# Patient Record
Sex: Female | Born: 1950 | Race: White | Hispanic: No | Marital: Married
Health system: Southern US, Community
[De-identification: ages and names within clinical notes are randomized; demographics above are authoritative.]

## PROBLEM LIST (undated history)

## (undated) DIAGNOSIS — J329 Chronic sinusitis, unspecified: Secondary | ICD-10-CM

## (undated) DIAGNOSIS — E113599 Type 2 diabetes mellitus with proliferative diabetic retinopathy without macular edema, unspecified eye: Secondary | ICD-10-CM

## (undated) DIAGNOSIS — I10 Essential (primary) hypertension: Secondary | ICD-10-CM

## (undated) DIAGNOSIS — N951 Menopausal and female climacteric states: Secondary | ICD-10-CM

## (undated) DIAGNOSIS — E119 Type 2 diabetes mellitus without complications: Secondary | ICD-10-CM

## (undated) DIAGNOSIS — E785 Hyperlipidemia, unspecified: Secondary | ICD-10-CM

## (undated) HISTORY — DX: Essential (primary) hypertension: I10

## (undated) HISTORY — PX: MOUTH SURGERY: SHX715

## (undated) HISTORY — DX: Type 2 diabetes mellitus without complications: E11.9

## (undated) HISTORY — DX: Type 2 diabetes mellitus with proliferative diabetic retinopathy without macular edema, unspecified eye: E11.3599

## (undated) HISTORY — DX: Hyperlipidemia, unspecified: E78.5

## (undated) HISTORY — DX: Chronic sinusitis, unspecified: J32.9

## (undated) HISTORY — DX: Menopausal and female climacteric states: N95.1

## (undated) HISTORY — PX: NASAL SINUS SURGERY: SHX719

---

## 1998-11-19 ENCOUNTER — Other Ambulatory Visit: Admission: RE | Admit: 1998-11-19 | Discharge: 1998-11-19 | Payer: Self-pay | Admitting: Oral Surgery

## 1999-11-29 ENCOUNTER — Other Ambulatory Visit: Admission: RE | Admit: 1999-11-29 | Discharge: 1999-11-29 | Payer: Self-pay | Admitting: Obstetrics & Gynecology

## 2004-11-08 ENCOUNTER — Other Ambulatory Visit: Admission: RE | Admit: 2004-11-08 | Discharge: 2004-11-08 | Payer: Self-pay | Admitting: Obstetrics & Gynecology

## 2010-07-13 ENCOUNTER — Other Ambulatory Visit: Payer: Self-pay | Admitting: Family Medicine

## 2010-07-13 DIAGNOSIS — E041 Nontoxic single thyroid nodule: Secondary | ICD-10-CM

## 2010-07-15 ENCOUNTER — Ambulatory Visit
Admission: RE | Admit: 2010-07-15 | Discharge: 2010-07-15 | Disposition: A | Discharge status: Home / Self Care | Payer: BLUE CROSS/BLUE SHIELD | Source: Ambulatory Visit | Attending: Family Medicine | Admitting: Family Medicine

## 2010-07-15 DIAGNOSIS — E041 Nontoxic single thyroid nodule: Secondary | ICD-10-CM

## 2011-07-04 ENCOUNTER — Other Ambulatory Visit: Payer: Self-pay | Admitting: Family Medicine

## 2011-07-04 DIAGNOSIS — E041 Nontoxic single thyroid nodule: Secondary | ICD-10-CM

## 2011-07-11 ENCOUNTER — Ambulatory Visit
Admission: RE | Admit: 2011-07-11 | Discharge: 2011-07-11 | Disposition: A | Discharge status: Home / Self Care | Payer: 59 | Source: Ambulatory Visit | Attending: Family Medicine | Admitting: Family Medicine

## 2011-07-11 DIAGNOSIS — E041 Nontoxic single thyroid nodule: Secondary | ICD-10-CM

## 2011-07-12 ENCOUNTER — Other Ambulatory Visit: Payer: Self-pay | Admitting: Family Medicine

## 2011-07-12 DIAGNOSIS — E041 Nontoxic single thyroid nodule: Secondary | ICD-10-CM

## 2012-03-08 IMAGING — US US SOFT TISSUE HEAD/NECK
1 series · 14 of 25 positions shown · non-contrast
Comparison: None.

CLINICAL DATA: Follow-up thyroid nodule identified on recent
carotid ultrasound.

THYROID ULTRASOUND 07/15/2010:
TECHNIQUE: Ultrasound examination of the thyroid gland and adjacent
soft tissues was performed.

[Series 1: us soft tissue head/neck · 0.07mm/px · 14 of 37 slices shown]
[im 1/37]
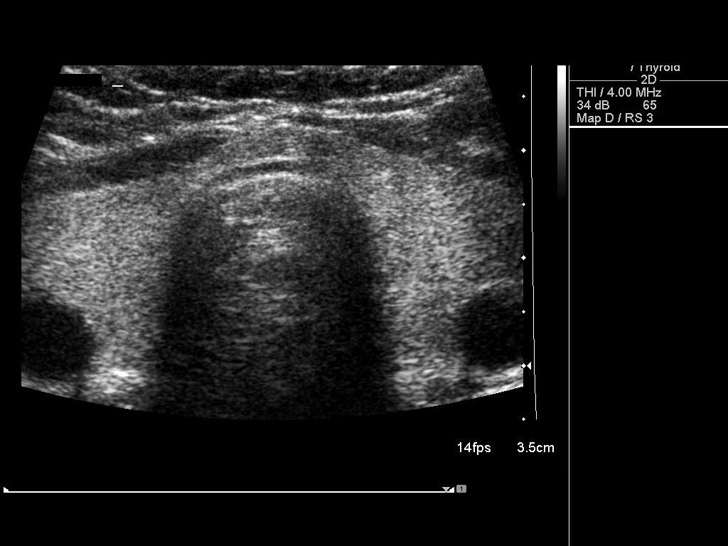
[im 4/37]
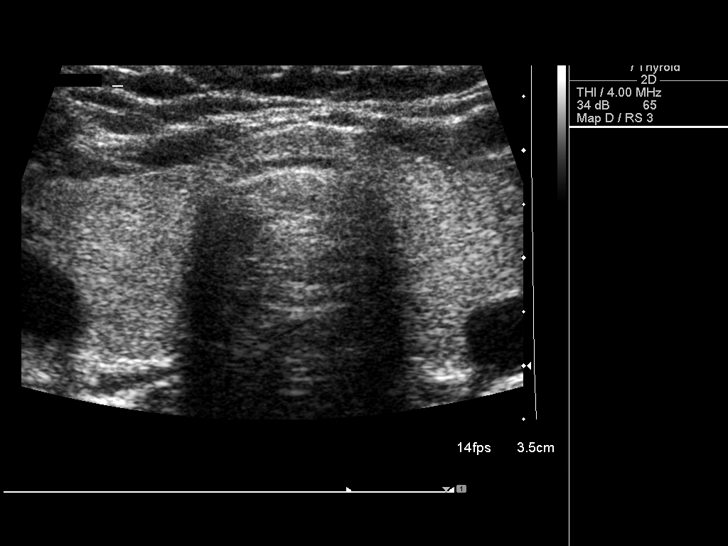
[im 7/37]
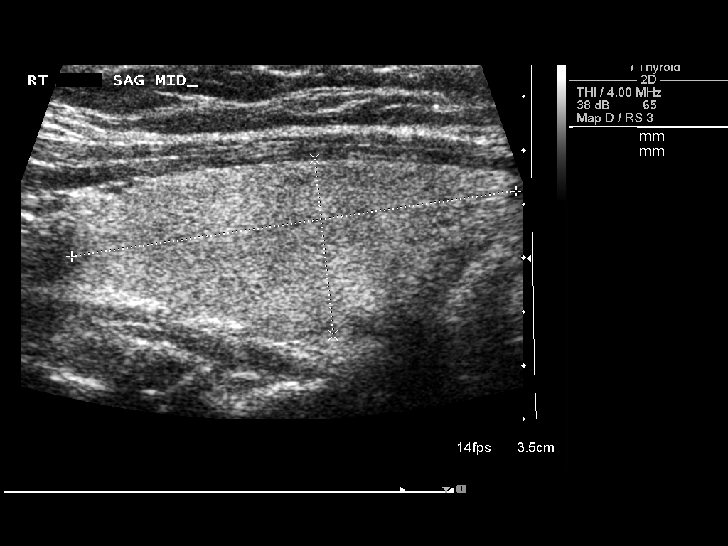
[im 10/37]
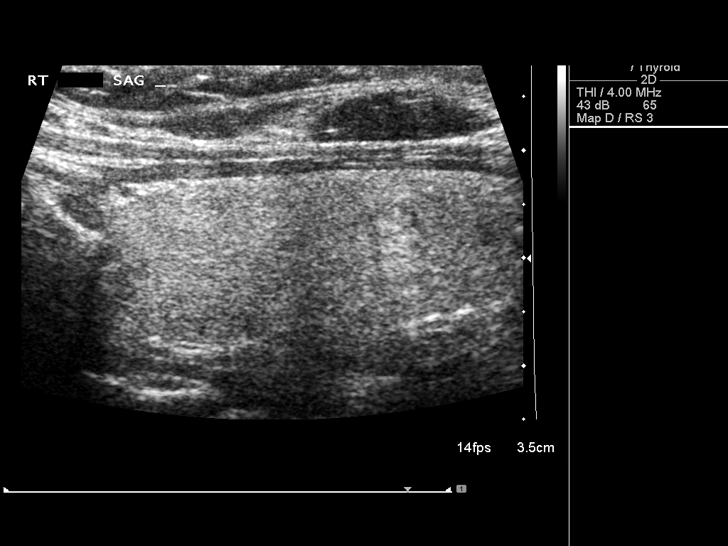
[im 13/37]
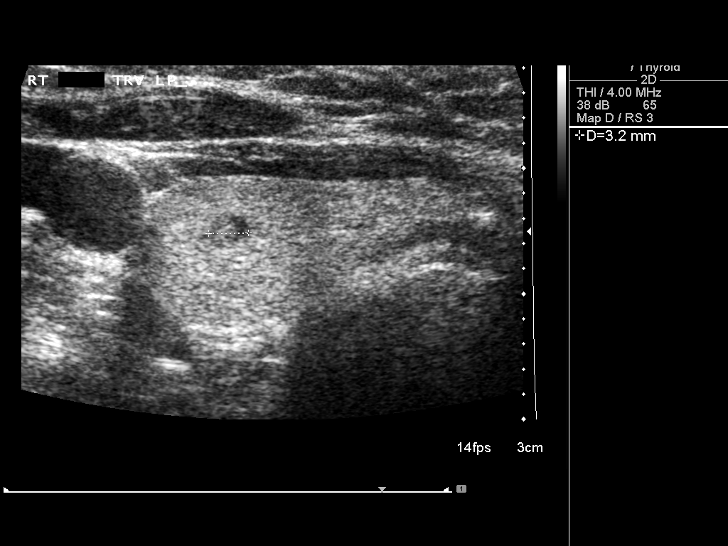
[im 14/37]
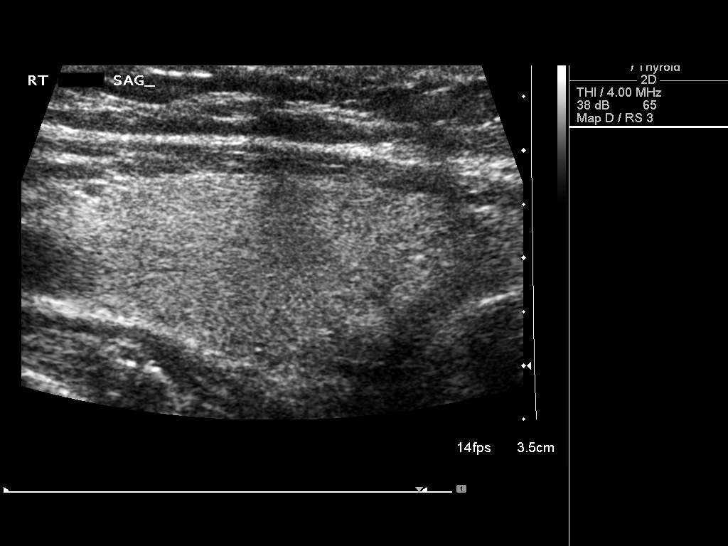
[im 17/37]
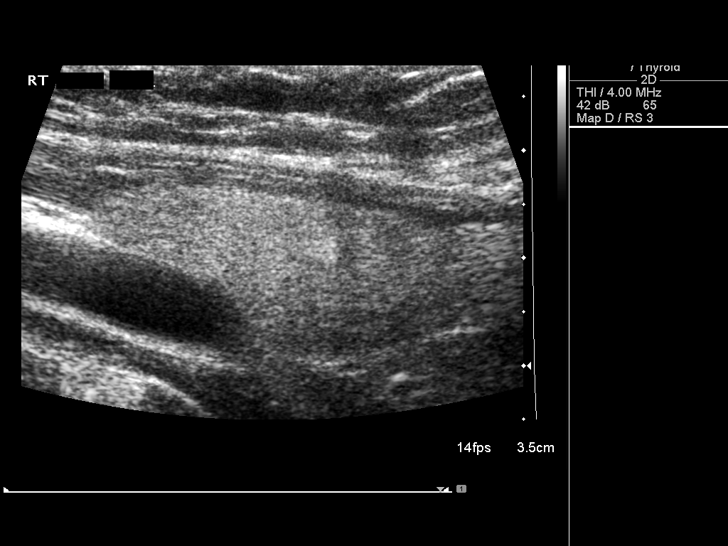
[im 20/37]
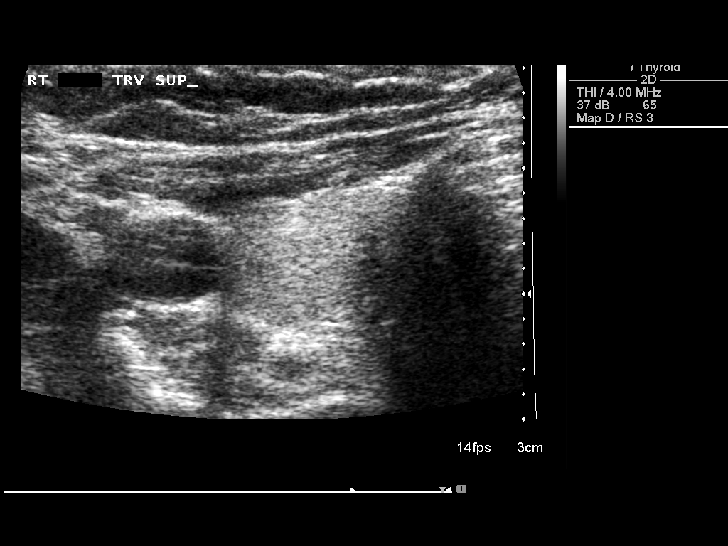
[im 23/37]
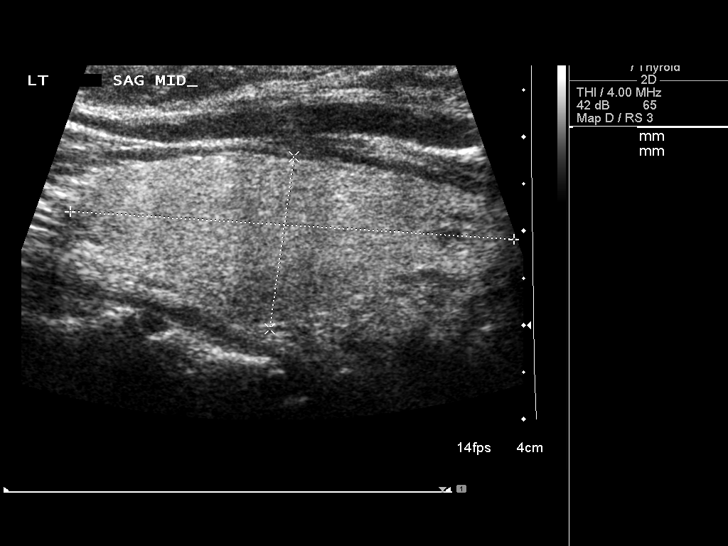
[im 25/37]
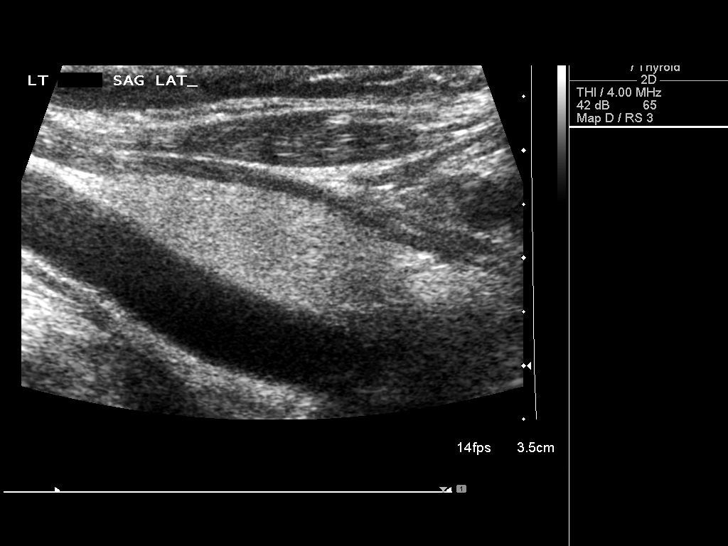
[im 28/37]
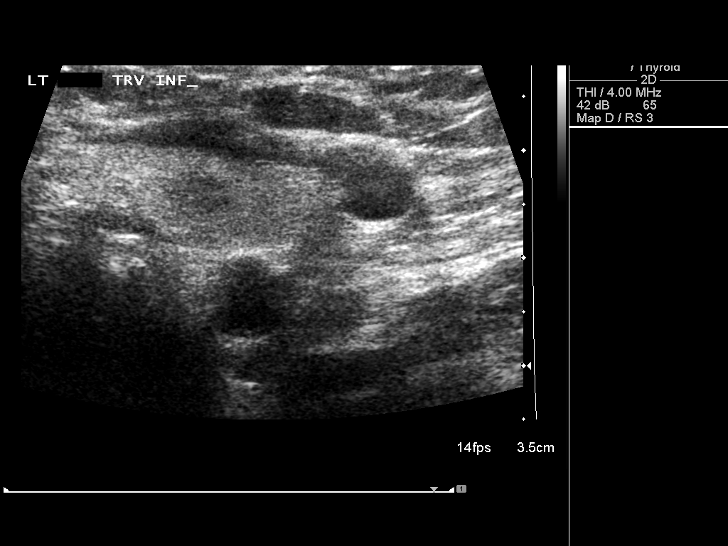
[im 31/37]
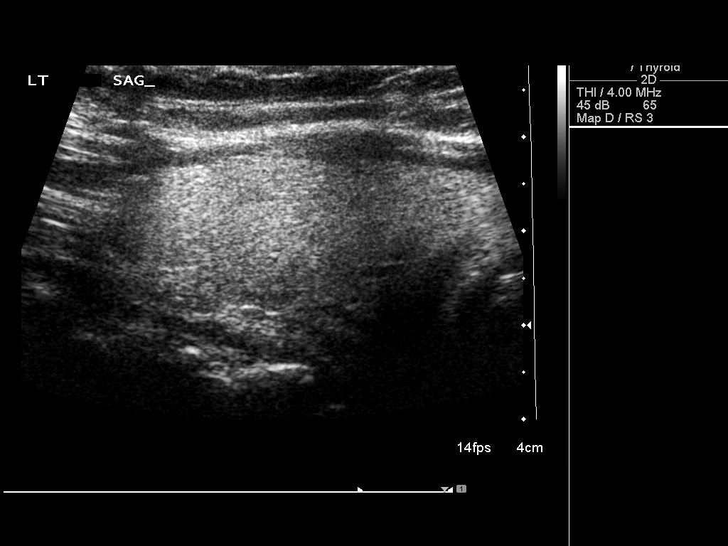
[im 34/37]
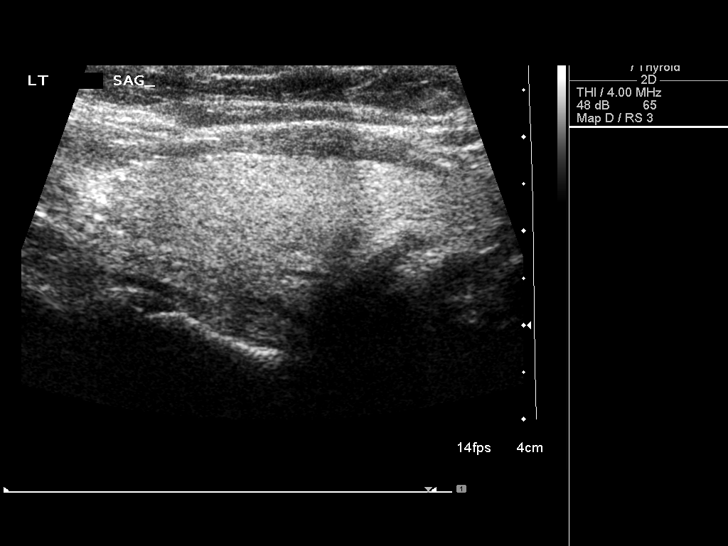
[im 37/37]
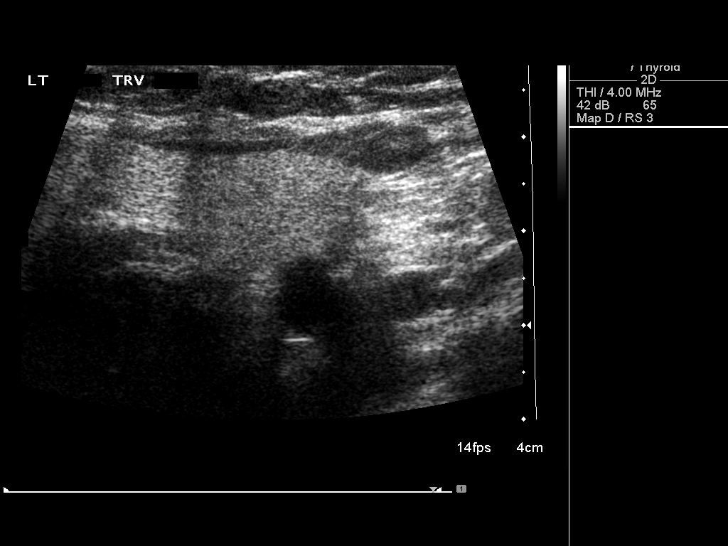

[14 of 25 positions shown; findings below may reference images not displayed]

FINDINGS: Right thyroid lobe:  Normal in size measuring approximately 4.2 x
1.7 x 1.7 cm.  Homogeneous echotexture.

Left thyroid lobe:  Normal in size measuring approximately 4.7 x
1.8 x 1.7 cm.  Homogeneous echotexture.

Isthmus:  Normal in appearance measuring 0.4 cm in thickness.

Focal nodules:
1.  Very small solid nodule in the lower pole of the right lobe
measuring 0.4 cm.
2.  Small solid nodule in the lower pole of the left lobe measuring
approximately 0.6 cm.
3.  No dominant nodules.

Lymphadenopathy:  None visualized.
IMPRESSION: Normal sized homogeneous thyroid gland containing 2 very small
nodules, neither of which require biopsy at this time.  Follow-up
thyroid ultrasound in 1 year may be helpful to confirm stability.

This recommendation follows the consensus statement:  Management of
Thyroid Nodules Detected as US:  [employer] in
800.  Available online at :
[URL]

## 2013-03-04 IMAGING — US US SOFT TISSUE HEAD/NECK
1 series · 14 of 25 positions shown · non-contrast
Comparison: 07/15/2010.

CLINICAL DATA: Follow-up thyroid nodule.

THYROID ULTRASOUND
TECHNIQUE: Ultrasound examination of the thyroid gland and adjacent
soft tissues was performed.

[Series 1: us soft tissue head/neck · 0.08mm/px · 14 of 41 slices shown]
[im 1/41]
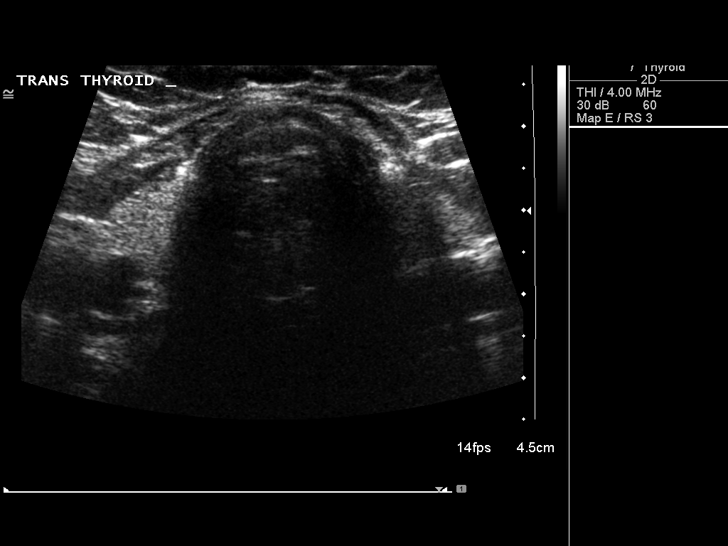
[im 4/41]
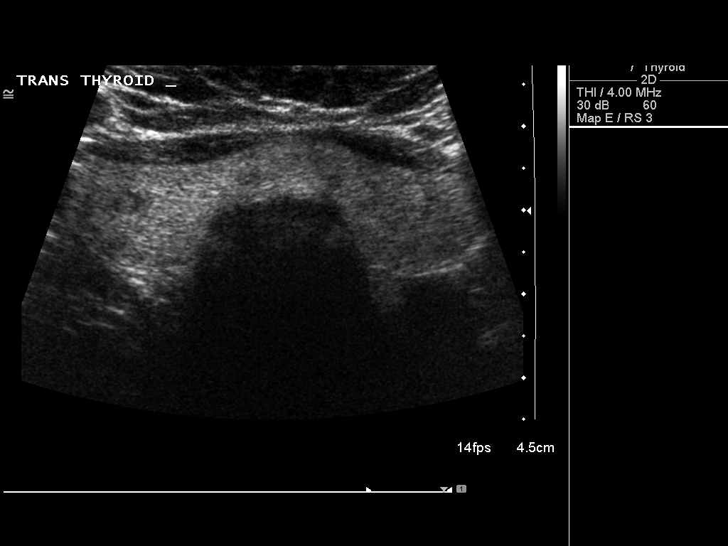
[im 7/41]
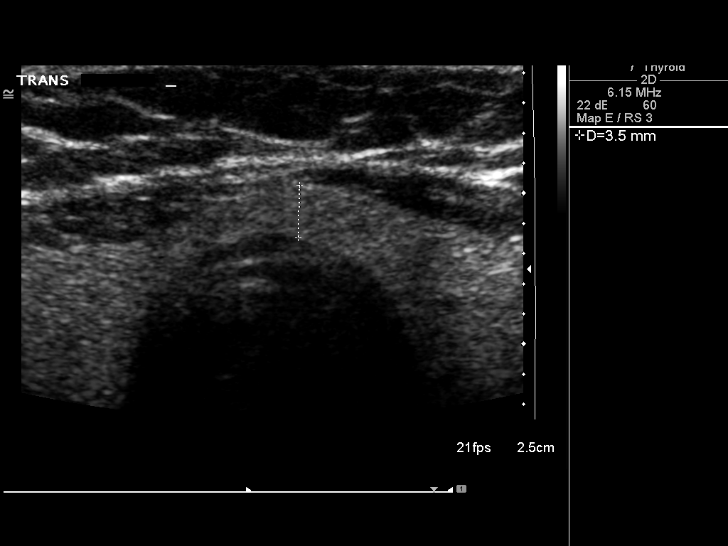
[im 11/41]
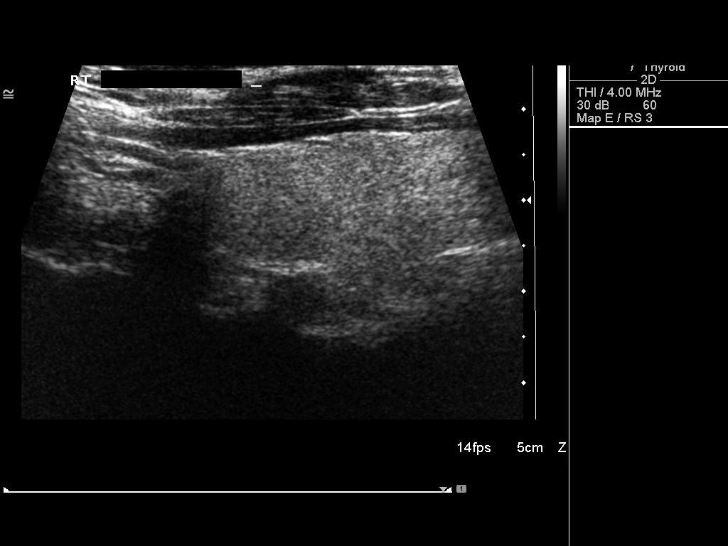
[im 14/41]
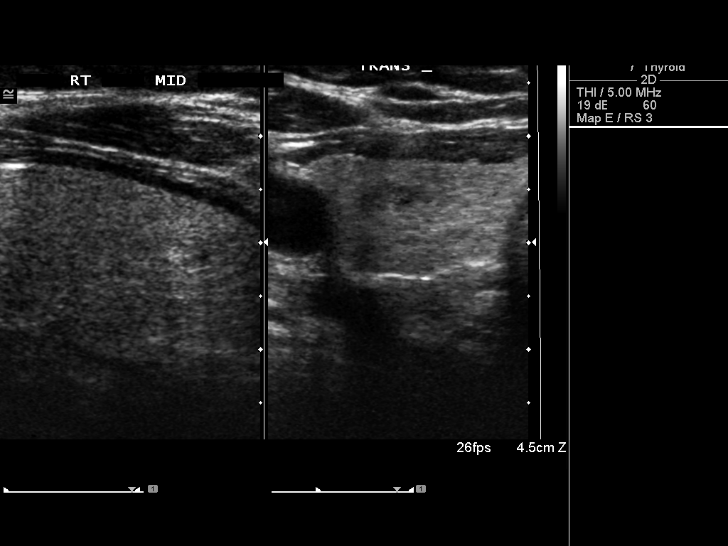
[im 16/41]
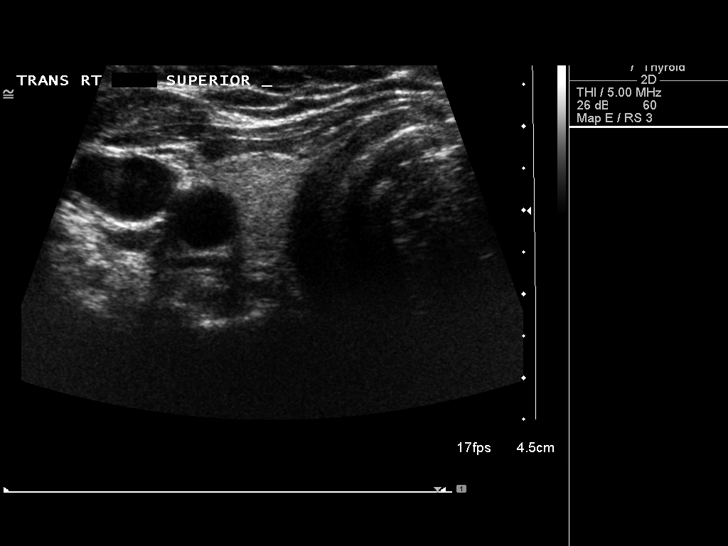
[im 19/41]
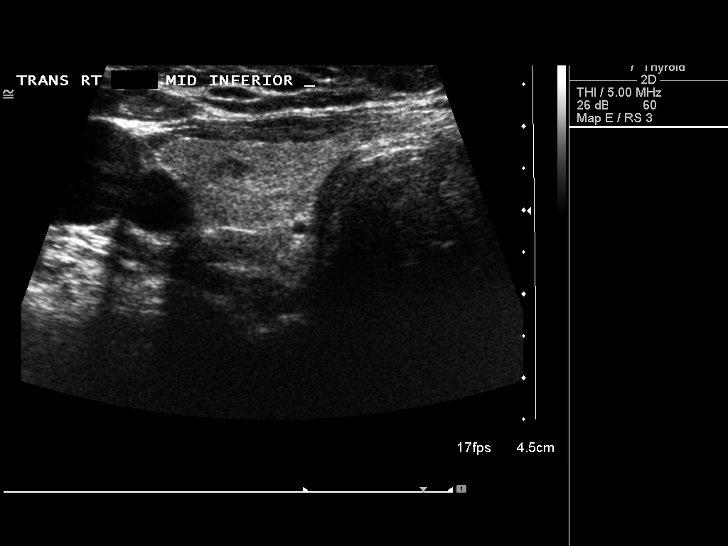
[im 22/41]
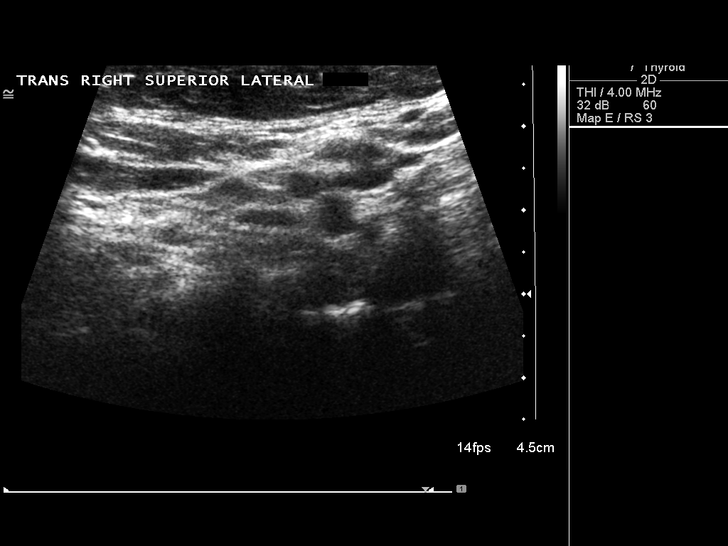
[im 26/41]
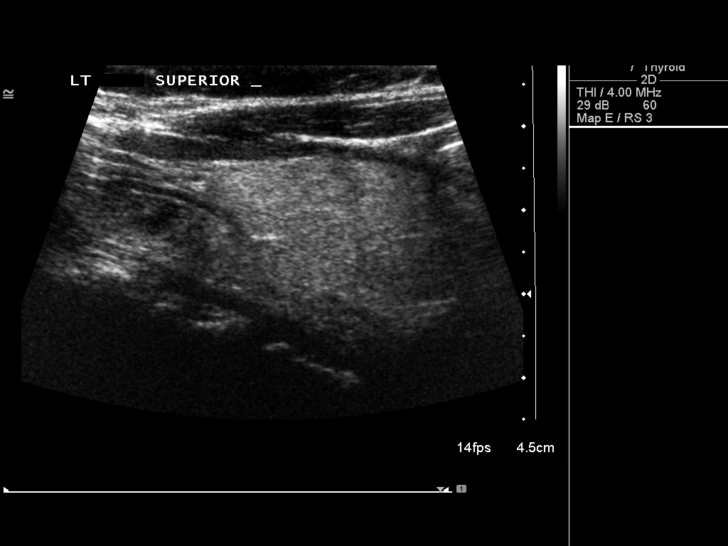
[im 27/41]
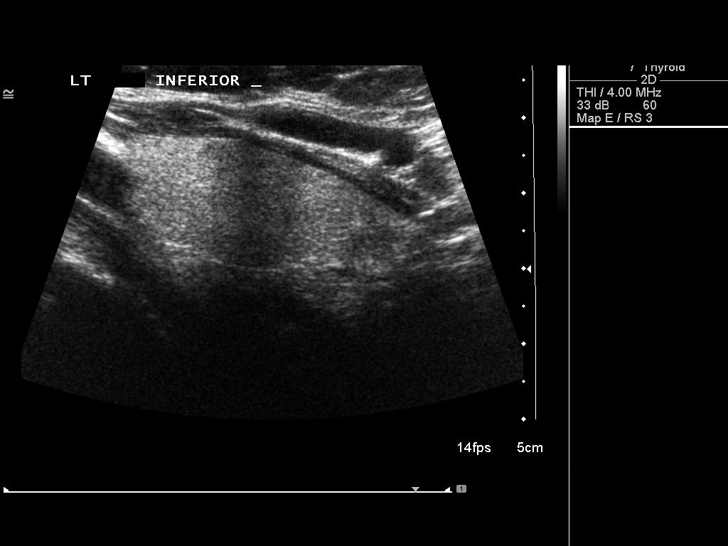
[im 31/41]
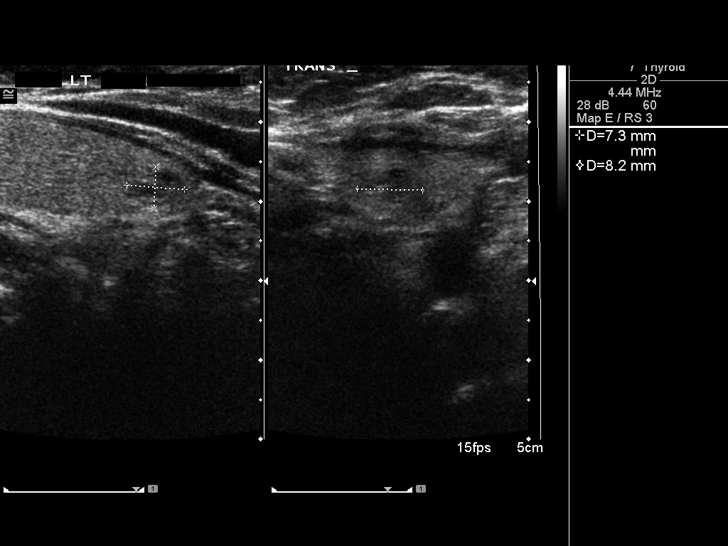
[im 34/41]
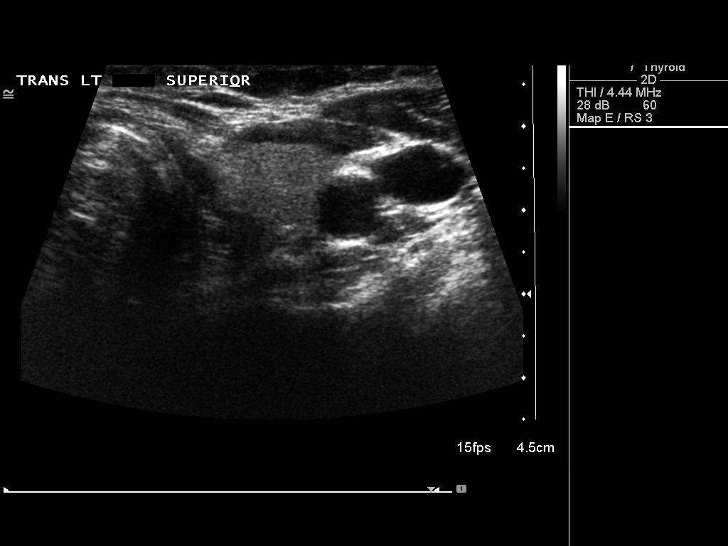
[im 37/41]
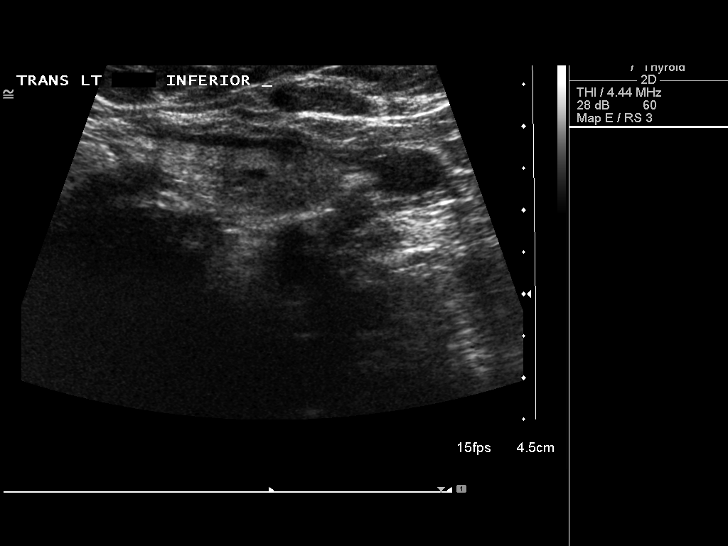
[im 41/41]
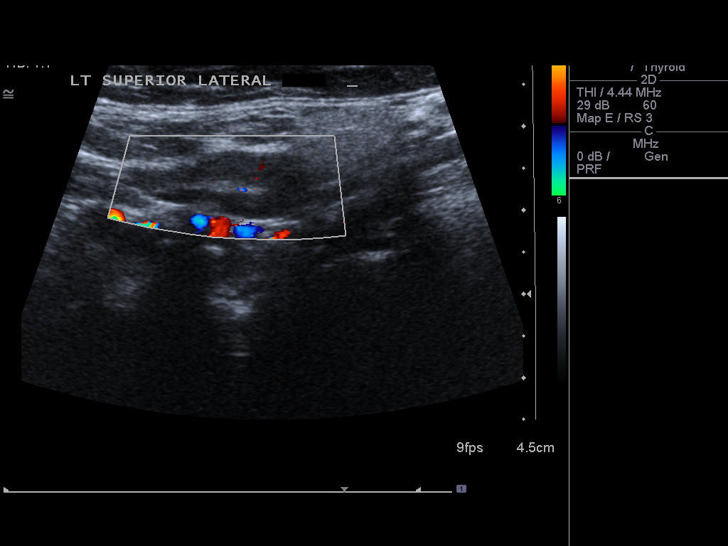

[14 of 25 positions shown; findings below may reference images not displayed]

FINDINGS: Right thyroid lobe:  4.8 x 1.5 x 2.0 cm (previously 4.2 x 1.7 x
cm), homogeneous.
Left thyroid lobe:  4.7 x 1.7 x 1.9 cm (previously 4.7 x 1.8 x
cm), homogeneous.
Isthmus:  4 mm.

Focal nodules:  Solid nodules are seen in the lower poles
bilaterally.  The largest measures 7 x 5 x 8 mm in the lower pole
left thyroid (previously 6 x 5 x 5 mm).

Lymphadenopathy:  None visualized.
IMPRESSION: Sub centimeter bilateral thyroid nodules, the largest of which is
likely stable and within the limits of measurement technique.

## 2013-10-17 ENCOUNTER — Encounter: Payer: Self-pay | Admitting: *Deleted

## 2015-10-13 DIAGNOSIS — E782 Mixed hyperlipidemia: Secondary | ICD-10-CM | POA: Diagnosis not present

## 2015-10-13 DIAGNOSIS — I10 Essential (primary) hypertension: Secondary | ICD-10-CM | POA: Diagnosis not present

## 2015-10-13 DIAGNOSIS — F3342 Major depressive disorder, recurrent, in full remission: Secondary | ICD-10-CM | POA: Diagnosis not present

## 2015-10-13 DIAGNOSIS — E119 Type 2 diabetes mellitus without complications: Secondary | ICD-10-CM | POA: Diagnosis not present

## 2015-10-16 DIAGNOSIS — Z0000 Encounter for general adult medical examination without abnormal findings: Secondary | ICD-10-CM | POA: Diagnosis not present

## 2015-10-16 DIAGNOSIS — I10 Essential (primary) hypertension: Secondary | ICD-10-CM | POA: Diagnosis not present

## 2015-10-16 DIAGNOSIS — E782 Mixed hyperlipidemia: Secondary | ICD-10-CM | POA: Diagnosis not present

## 2015-10-16 DIAGNOSIS — F3342 Major depressive disorder, recurrent, in full remission: Secondary | ICD-10-CM | POA: Diagnosis not present

## 2015-10-16 DIAGNOSIS — E119 Type 2 diabetes mellitus without complications: Secondary | ICD-10-CM | POA: Diagnosis not present

## 2015-11-16 DIAGNOSIS — M810 Age-related osteoporosis without current pathological fracture: Secondary | ICD-10-CM | POA: Diagnosis not present

## 2015-11-16 DIAGNOSIS — M85852 Other specified disorders of bone density and structure, left thigh: Secondary | ICD-10-CM | POA: Diagnosis not present

## 2015-12-03 DIAGNOSIS — M816 Localized osteoporosis [Lequesne]: Secondary | ICD-10-CM | POA: Diagnosis not present

## 2016-04-19 DIAGNOSIS — F3342 Major depressive disorder, recurrent, in full remission: Secondary | ICD-10-CM | POA: Diagnosis not present

## 2016-04-19 DIAGNOSIS — I10 Essential (primary) hypertension: Secondary | ICD-10-CM | POA: Diagnosis not present

## 2016-04-19 DIAGNOSIS — E119 Type 2 diabetes mellitus without complications: Secondary | ICD-10-CM | POA: Diagnosis not present

## 2016-04-19 DIAGNOSIS — E782 Mixed hyperlipidemia: Secondary | ICD-10-CM | POA: Diagnosis not present

## 2016-10-25 DIAGNOSIS — E119 Type 2 diabetes mellitus without complications: Secondary | ICD-10-CM | POA: Diagnosis not present

## 2016-10-25 DIAGNOSIS — E782 Mixed hyperlipidemia: Secondary | ICD-10-CM | POA: Diagnosis not present

## 2016-10-28 DIAGNOSIS — E1165 Type 2 diabetes mellitus with hyperglycemia: Secondary | ICD-10-CM | POA: Diagnosis not present

## 2016-10-28 DIAGNOSIS — I10 Essential (primary) hypertension: Secondary | ICD-10-CM | POA: Diagnosis not present

## 2016-10-28 DIAGNOSIS — Z23 Encounter for immunization: Secondary | ICD-10-CM | POA: Diagnosis not present

## 2016-10-28 DIAGNOSIS — E782 Mixed hyperlipidemia: Secondary | ICD-10-CM | POA: Diagnosis not present

## 2016-10-28 DIAGNOSIS — F3342 Major depressive disorder, recurrent, in full remission: Secondary | ICD-10-CM | POA: Diagnosis not present

## 2016-10-28 DIAGNOSIS — Z0000 Encounter for general adult medical examination without abnormal findings: Secondary | ICD-10-CM | POA: Diagnosis not present

## 2016-12-08 DIAGNOSIS — J069 Acute upper respiratory infection, unspecified: Secondary | ICD-10-CM | POA: Diagnosis not present

## 2016-12-20 DIAGNOSIS — J209 Acute bronchitis, unspecified: Secondary | ICD-10-CM | POA: Diagnosis not present

## 2017-04-25 DIAGNOSIS — I10 Essential (primary) hypertension: Secondary | ICD-10-CM | POA: Diagnosis not present

## 2017-04-25 DIAGNOSIS — M810 Age-related osteoporosis without current pathological fracture: Secondary | ICD-10-CM | POA: Diagnosis not present

## 2017-04-25 DIAGNOSIS — E782 Mixed hyperlipidemia: Secondary | ICD-10-CM | POA: Diagnosis not present

## 2017-04-25 DIAGNOSIS — E119 Type 2 diabetes mellitus without complications: Secondary | ICD-10-CM | POA: Diagnosis not present

## 2017-04-25 DIAGNOSIS — Z1211 Encounter for screening for malignant neoplasm of colon: Secondary | ICD-10-CM | POA: Diagnosis not present

## 2017-04-25 DIAGNOSIS — Z1159 Encounter for screening for other viral diseases: Secondary | ICD-10-CM | POA: Diagnosis not present

## 2017-10-17 DIAGNOSIS — H1131 Conjunctival hemorrhage, right eye: Secondary | ICD-10-CM | POA: Diagnosis not present

## 2017-10-17 DIAGNOSIS — H18891 Other specified disorders of cornea, right eye: Secondary | ICD-10-CM | POA: Diagnosis not present

## 2017-10-17 DIAGNOSIS — H40013 Open angle with borderline findings, low risk, bilateral: Secondary | ICD-10-CM | POA: Diagnosis not present

## 2017-10-17 DIAGNOSIS — H16421 Pannus (corneal), right eye: Secondary | ICD-10-CM | POA: Diagnosis not present

## 2017-11-10 DIAGNOSIS — E782 Mixed hyperlipidemia: Secondary | ICD-10-CM | POA: Diagnosis not present

## 2017-11-10 DIAGNOSIS — E119 Type 2 diabetes mellitus without complications: Secondary | ICD-10-CM | POA: Diagnosis not present

## 2017-11-10 DIAGNOSIS — M810 Age-related osteoporosis without current pathological fracture: Secondary | ICD-10-CM | POA: Diagnosis not present

## 2017-11-13 DIAGNOSIS — I10 Essential (primary) hypertension: Secondary | ICD-10-CM | POA: Diagnosis not present

## 2017-11-13 DIAGNOSIS — E782 Mixed hyperlipidemia: Secondary | ICD-10-CM | POA: Diagnosis not present

## 2017-11-13 DIAGNOSIS — F3342 Major depressive disorder, recurrent, in full remission: Secondary | ICD-10-CM | POA: Diagnosis not present

## 2017-11-13 DIAGNOSIS — E119 Type 2 diabetes mellitus without complications: Secondary | ICD-10-CM | POA: Diagnosis not present

## 2017-11-13 DIAGNOSIS — Z0000 Encounter for general adult medical examination without abnormal findings: Secondary | ICD-10-CM | POA: Diagnosis not present

## 2017-11-13 DIAGNOSIS — Z23 Encounter for immunization: Secondary | ICD-10-CM | POA: Diagnosis not present

## 2017-11-21 DIAGNOSIS — H18891 Other specified disorders of cornea, right eye: Secondary | ICD-10-CM | POA: Diagnosis not present

## 2017-11-21 DIAGNOSIS — H04123 Dry eye syndrome of bilateral lacrimal glands: Secondary | ICD-10-CM | POA: Diagnosis not present

## 2017-11-21 DIAGNOSIS — H16421 Pannus (corneal), right eye: Secondary | ICD-10-CM | POA: Diagnosis not present

## 2017-11-21 DIAGNOSIS — H40013 Open angle with borderline findings, low risk, bilateral: Secondary | ICD-10-CM | POA: Diagnosis not present

## 2018-01-02 DIAGNOSIS — M8588 Other specified disorders of bone density and structure, other site: Secondary | ICD-10-CM | POA: Diagnosis not present

## 2018-01-02 DIAGNOSIS — M810 Age-related osteoporosis without current pathological fracture: Secondary | ICD-10-CM | POA: Diagnosis not present

## 2018-01-28 DIAGNOSIS — J069 Acute upper respiratory infection, unspecified: Secondary | ICD-10-CM | POA: Diagnosis not present

## 2018-02-04 DIAGNOSIS — J069 Acute upper respiratory infection, unspecified: Secondary | ICD-10-CM | POA: Diagnosis not present

## 2018-02-07 DIAGNOSIS — J209 Acute bronchitis, unspecified: Secondary | ICD-10-CM | POA: Diagnosis not present

## 2018-05-07 DIAGNOSIS — Z124 Encounter for screening for malignant neoplasm of cervix: Secondary | ICD-10-CM | POA: Diagnosis not present

## 2018-05-07 DIAGNOSIS — Z1231 Encounter for screening mammogram for malignant neoplasm of breast: Secondary | ICD-10-CM | POA: Diagnosis not present

## 2018-05-07 DIAGNOSIS — Z01419 Encounter for gynecological examination (general) (routine) without abnormal findings: Secondary | ICD-10-CM | POA: Diagnosis not present

## 2018-05-07 DIAGNOSIS — Z6838 Body mass index (BMI) 38.0-38.9, adult: Secondary | ICD-10-CM | POA: Diagnosis not present

## 2018-05-18 DIAGNOSIS — E119 Type 2 diabetes mellitus without complications: Secondary | ICD-10-CM | POA: Diagnosis not present

## 2018-05-21 DIAGNOSIS — I10 Essential (primary) hypertension: Secondary | ICD-10-CM | POA: Diagnosis not present

## 2018-05-21 DIAGNOSIS — Z23 Encounter for immunization: Secondary | ICD-10-CM | POA: Diagnosis not present

## 2018-05-21 DIAGNOSIS — E1159 Type 2 diabetes mellitus with other circulatory complications: Secondary | ICD-10-CM | POA: Diagnosis not present

## 2018-05-21 DIAGNOSIS — E782 Mixed hyperlipidemia: Secondary | ICD-10-CM | POA: Diagnosis not present

## 2018-05-21 DIAGNOSIS — F3342 Major depressive disorder, recurrent, in full remission: Secondary | ICD-10-CM | POA: Diagnosis not present

## 2018-06-05 DIAGNOSIS — H43811 Vitreous degeneration, right eye: Secondary | ICD-10-CM | POA: Diagnosis not present

## 2018-06-05 DIAGNOSIS — H40013 Open angle with borderline findings, low risk, bilateral: Secondary | ICD-10-CM | POA: Diagnosis not present

## 2018-06-05 DIAGNOSIS — H2513 Age-related nuclear cataract, bilateral: Secondary | ICD-10-CM | POA: Diagnosis not present

## 2018-06-05 DIAGNOSIS — E119 Type 2 diabetes mellitus without complications: Secondary | ICD-10-CM | POA: Diagnosis not present

## 2018-06-05 DIAGNOSIS — H5213 Myopia, bilateral: Secondary | ICD-10-CM | POA: Diagnosis not present

## 2019-05-30 ENCOUNTER — Ambulatory Visit: Payer: Self-pay | Attending: Internal Medicine

## 2019-05-30 DIAGNOSIS — Z23 Encounter for immunization: Secondary | ICD-10-CM | POA: Insufficient documentation

## 2019-05-30 NOTE — Progress Notes (Signed)
   Covid-19 Vaccination Clinic  Name:  Courtney Mccormick    MRN: [id number] DOB: 21-Aug-1950  05/30/2019  [name] was observed post Covid-19 immunization for 15 minutes without incidence. She was provided with Vaccine Information Sheet and instruction to access the V-Safe system.   [name] was instructed to call 911 with any severe reactions post vaccine: Marland Kitchen Difficulty breathing  . Swelling of your face and throat  . A fast heartbeat  . A bad rash all over your body  . Dizziness and weakness    Immunizations Administered    Name Date Dose VIS Date Route   Pfizer COVID-19 Vaccine 05/30/2019  1:47 PM 0.3 mL 03/15/2019 Intramuscular   Manufacturer: [employer], [employer]   Lot: [id number]   NDC: [address]-9

## 2019-06-25 ENCOUNTER — Ambulatory Visit: Payer: Self-pay | Attending: Internal Medicine

## 2019-06-25 DIAGNOSIS — Z23 Encounter for immunization: Secondary | ICD-10-CM

## 2019-06-25 NOTE — Progress Notes (Signed)
   Covid-19 Vaccination Clinic  Name:  Courtney Mccormick    MRN: [id number] DOB: 04-03-1951  06/25/2019  Ms. Grams was observed post Covid-19 immunization for 30 minutes based on pre-vaccination screening without incident. She was provided with Vaccine Information Sheet and instruction to access the V-Safe system.   [name] was instructed to call 911 with any severe reactions post vaccine: Marland Kitchen Difficulty breathing  . Swelling of face and throat  . A fast heartbeat  . A bad rash all over body  . Dizziness and weakness   Immunizations Administered    Name Date Dose VIS Date Route   Pfizer COVID-19 Vaccine 06/25/2019 12:43 PM 0.3 mL 03/15/2019 Intramuscular   Manufacturer: [employer], [employer]   Lot: [id number]   NDC: [address]-2

## 2019-12-31 ENCOUNTER — Other Ambulatory Visit: Payer: Self-pay | Admitting: Family Medicine

## 2019-12-31 DIAGNOSIS — M810 Age-related osteoporosis without current pathological fracture: Secondary | ICD-10-CM

## 2020-01-18 ENCOUNTER — Ambulatory Visit: Payer: Self-pay | Attending: Internal Medicine

## 2020-01-18 DIAGNOSIS — Z23 Encounter for immunization: Secondary | ICD-10-CM

## 2020-01-18 NOTE — Progress Notes (Signed)
   Covid-19 Vaccination Clinic  Name:  MAKAIYA GEERDES    MRN: [id number] DOB: [birth year]  01/18/2020  Ms. Joa was observed post Covid-19 immunization for 15 minutes without incident. She was provided with Vaccine Information Sheet and instruction to access the V-Safe system.   Ms. Germer was instructed to call 911 with any severe reactions post vaccine: Marland Kitchen Difficulty breathing  . Swelling of face and throat  . A fast heartbeat  . A bad rash all over body  . Dizziness and weakness

## 2020-04-09 ENCOUNTER — Other Ambulatory Visit: Payer: Self-pay

## 2020-07-03 ENCOUNTER — Other Ambulatory Visit: Payer: Self-pay

## 2020-07-03 ENCOUNTER — Ambulatory Visit
Admission: RE | Admit: 2020-07-03 | Discharge: 2020-07-03 | Disposition: A | Discharge status: Home / Self Care | Payer: 59 | Source: Ambulatory Visit | Attending: Family Medicine | Admitting: Family Medicine

## 2020-07-03 DIAGNOSIS — M810 Age-related osteoporosis without current pathological fracture: Secondary | ICD-10-CM

## 2022-07-26 ENCOUNTER — Other Ambulatory Visit: Payer: Self-pay | Admitting: Family Medicine

## 2022-07-26 DIAGNOSIS — M810 Age-related osteoporosis without current pathological fracture: Secondary | ICD-10-CM

## 2022-09-20 DIAGNOSIS — H2511 Age-related nuclear cataract, right eye: Secondary | ICD-10-CM | POA: Diagnosis not present

## 2022-09-20 DIAGNOSIS — H25811 Combined forms of age-related cataract, right eye: Secondary | ICD-10-CM | POA: Diagnosis not present

## 2022-10-02 DIAGNOSIS — F4323 Adjustment disorder with mixed anxiety and depressed mood: Secondary | ICD-10-CM | POA: Diagnosis not present

## 2022-10-17 DIAGNOSIS — H25042 Posterior subcapsular polar age-related cataract, left eye: Secondary | ICD-10-CM | POA: Diagnosis not present

## 2022-10-17 DIAGNOSIS — H25012 Cortical age-related cataract, left eye: Secondary | ICD-10-CM | POA: Diagnosis not present

## 2022-10-17 DIAGNOSIS — H2512 Age-related nuclear cataract, left eye: Secondary | ICD-10-CM | POA: Diagnosis not present

## 2022-10-25 DIAGNOSIS — H25012 Cortical age-related cataract, left eye: Secondary | ICD-10-CM | POA: Diagnosis not present

## 2022-10-25 DIAGNOSIS — H25042 Posterior subcapsular polar age-related cataract, left eye: Secondary | ICD-10-CM | POA: Diagnosis not present

## 2022-10-25 DIAGNOSIS — H268 Other specified cataract: Secondary | ICD-10-CM | POA: Diagnosis not present

## 2022-10-25 DIAGNOSIS — H2512 Age-related nuclear cataract, left eye: Secondary | ICD-10-CM | POA: Diagnosis not present

## 2022-11-02 DIAGNOSIS — F4323 Adjustment disorder with mixed anxiety and depressed mood: Secondary | ICD-10-CM | POA: Diagnosis not present

## 2022-11-03 DIAGNOSIS — E1165 Type 2 diabetes mellitus with hyperglycemia: Secondary | ICD-10-CM | POA: Diagnosis not present

## 2023-01-16 DIAGNOSIS — M810 Age-related osteoporosis without current pathological fracture: Secondary | ICD-10-CM | POA: Diagnosis not present

## 2023-01-16 DIAGNOSIS — E782 Mixed hyperlipidemia: Secondary | ICD-10-CM | POA: Diagnosis not present

## 2023-01-16 DIAGNOSIS — E1165 Type 2 diabetes mellitus with hyperglycemia: Secondary | ICD-10-CM | POA: Diagnosis not present

## 2023-01-23 DIAGNOSIS — F439 Reaction to severe stress, unspecified: Secondary | ICD-10-CM | POA: Diagnosis not present

## 2023-01-23 DIAGNOSIS — Z23 Encounter for immunization: Secondary | ICD-10-CM | POA: Diagnosis not present

## 2023-01-23 DIAGNOSIS — E1159 Type 2 diabetes mellitus with other circulatory complications: Secondary | ICD-10-CM | POA: Diagnosis not present

## 2023-01-23 DIAGNOSIS — I10 Essential (primary) hypertension: Secondary | ICD-10-CM | POA: Diagnosis not present

## 2023-01-23 DIAGNOSIS — F419 Anxiety disorder, unspecified: Secondary | ICD-10-CM | POA: Diagnosis not present

## 2023-02-02 ENCOUNTER — Ambulatory Visit
Admission: RE | Admit: 2023-02-02 | Discharge: 2023-02-02 | Disposition: A | Discharge status: Home / Self Care | Payer: BC Managed Care – PPO | Source: Ambulatory Visit | Attending: Family Medicine | Admitting: Family Medicine

## 2023-02-02 DIAGNOSIS — N958 Other specified menopausal and perimenopausal disorders: Secondary | ICD-10-CM | POA: Diagnosis not present

## 2023-02-02 DIAGNOSIS — E349 Endocrine disorder, unspecified: Secondary | ICD-10-CM | POA: Diagnosis not present

## 2023-02-02 DIAGNOSIS — M810 Age-related osteoporosis without current pathological fracture: Secondary | ICD-10-CM

## 2023-02-02 DIAGNOSIS — M8588 Other specified disorders of bone density and structure, other site: Secondary | ICD-10-CM | POA: Diagnosis not present

## 2023-02-16 DIAGNOSIS — H40013 Open angle with borderline findings, low risk, bilateral: Secondary | ICD-10-CM | POA: Diagnosis not present

## 2023-02-23 DIAGNOSIS — M810 Age-related osteoporosis without current pathological fracture: Secondary | ICD-10-CM | POA: Diagnosis not present

## 2023-02-23 DIAGNOSIS — Z6836 Body mass index (BMI) 36.0-36.9, adult: Secondary | ICD-10-CM | POA: Diagnosis not present

## 2023-06-12 DIAGNOSIS — E119 Type 2 diabetes mellitus without complications: Secondary | ICD-10-CM | POA: Diagnosis not present

## 2023-06-12 DIAGNOSIS — H04123 Dry eye syndrome of bilateral lacrimal glands: Secondary | ICD-10-CM | POA: Diagnosis not present

## 2023-06-12 DIAGNOSIS — H43813 Vitreous degeneration, bilateral: Secondary | ICD-10-CM | POA: Diagnosis not present

## 2023-06-12 DIAGNOSIS — H40013 Open angle with borderline findings, low risk, bilateral: Secondary | ICD-10-CM | POA: Diagnosis not present

## 2023-07-18 DIAGNOSIS — E1159 Type 2 diabetes mellitus with other circulatory complications: Secondary | ICD-10-CM | POA: Diagnosis not present

## 2023-07-25 DIAGNOSIS — F321 Major depressive disorder, single episode, moderate: Secondary | ICD-10-CM | POA: Diagnosis not present

## 2023-07-25 DIAGNOSIS — E782 Mixed hyperlipidemia: Secondary | ICD-10-CM | POA: Diagnosis not present

## 2023-07-25 DIAGNOSIS — I10 Essential (primary) hypertension: Secondary | ICD-10-CM | POA: Diagnosis not present

## 2023-07-25 DIAGNOSIS — E1159 Type 2 diabetes mellitus with other circulatory complications: Secondary | ICD-10-CM | POA: Diagnosis not present
